# Patient Record
Sex: Female | Born: 1965 | Race: Black or African American | Smoking: Current every day smoker
Health system: Southern US, Community
[De-identification: ages and names within clinical notes are randomized; demographics above are authoritative.]

## PROBLEM LIST (undated history)

## (undated) DIAGNOSIS — I10 Essential (primary) hypertension: Secondary | ICD-10-CM

## (undated) DIAGNOSIS — E7800 Pure hypercholesterolemia, unspecified: Secondary | ICD-10-CM

## (undated) HISTORY — DX: Essential (primary) hypertension: I10

## (undated) HISTORY — PX: DILATION AND CURETTAGE OF UTERUS: SHX78

## (undated) HISTORY — DX: Pure hypercholesterolemia, unspecified: E78.00

## (undated) HISTORY — PX: TUBAL LIGATION: SHX77

## (undated) HISTORY — PX: KNEE SURGERY: SHX244

---

## 2019-09-29 ENCOUNTER — Ambulatory Visit: Payer: Self-pay | Attending: Internal Medicine

## 2019-09-29 DIAGNOSIS — Z23 Encounter for immunization: Secondary | ICD-10-CM

## 2019-09-29 NOTE — Progress Notes (Signed)
   Covid-19 Vaccination Clinic  Name:  Jeanette Tran    MRN: [id number] DOB: [birth year]  09/29/2019  [name] was observed post Covid-19 immunization for 15 minutes without incident. She was provided with Vaccine Information Sheet and instruction to access the V-Safe system.   [name] was instructed to call 911 with any severe reactions post vaccine: Marland Kitchen Difficulty breathing  . Swelling of face and throat  . A fast heartbeat  . A bad rash all over body  . Dizziness and weakness   Immunizations Administered    Name Date Dose VIS Date Route   Moderna COVID-19 Vaccine 09/29/2019 11:40 AM 0.5 mL 06/20/2019 Intramuscular   Manufacturer: Moderna   Lot: [id number]   NDC: 01751-025-85

## 2019-11-01 ENCOUNTER — Ambulatory Visit: Payer: Self-pay | Attending: Internal Medicine

## 2019-11-01 DIAGNOSIS — Z23 Encounter for immunization: Secondary | ICD-10-CM

## 2019-11-01 NOTE — Progress Notes (Signed)
   Covid-19 Vaccination Clinic  Name:  Citlaly Camplin    MRN: [id number] DOB: [birth year]  11/01/2019  Ms. Cadiente was observed post Covid-19 immunization for 15 minutes without incident. She was provided with Vaccine Information Sheet and instruction to access the V-Safe system.   Ms. Hilgeman was instructed to call 911 with any severe reactions post vaccine: Marland Kitchen Difficulty breathing  . Swelling of face and throat  . A fast heartbeat  . A bad rash all over body  . Dizziness and weakness   Immunizations Administered    Name Date Dose VIS Date Route   Moderna COVID-19 Vaccine 11/01/2019 11:30 AM 0.5 mL 06/20/2019 Intramuscular   Manufacturer: Moderna   Lot: [id number]   NDC: 96283-662-94

## 2021-04-21 ENCOUNTER — Encounter: Payer: Self-pay | Admitting: Obstetrics & Gynecology

## 2021-04-21 ENCOUNTER — Ambulatory Visit (INDEPENDENT_AMBULATORY_CARE_PROVIDER_SITE_OTHER): Payer: 59 | Admitting: Obstetrics & Gynecology

## 2021-04-21 ENCOUNTER — Other Ambulatory Visit: Payer: Self-pay

## 2021-04-21 ENCOUNTER — Other Ambulatory Visit (HOSPITAL_COMMUNITY)
Admission: RE | Admit: 2021-04-21 | Discharge: 2021-04-21 | Disposition: A | Discharge status: Home / Self Care | Payer: 59 | Source: Ambulatory Visit | Attending: Obstetrics & Gynecology | Admitting: Obstetrics & Gynecology

## 2021-04-21 VITALS — BP 131/84 | HR 81 | Ht 63.0 in | Wt 183.0 lb

## 2021-04-21 DIAGNOSIS — Z01419 Encounter for gynecological examination (general) (routine) without abnormal findings: Secondary | ICD-10-CM | POA: Diagnosis not present

## 2021-04-21 DIAGNOSIS — Z1231 Encounter for screening mammogram for malignant neoplasm of breast: Secondary | ICD-10-CM | POA: Diagnosis not present

## 2021-04-21 DIAGNOSIS — Z1212 Encounter for screening for malignant neoplasm of rectum: Secondary | ICD-10-CM | POA: Diagnosis not present

## 2021-04-21 DIAGNOSIS — Z124 Encounter for screening for malignant neoplasm of cervix: Secondary | ICD-10-CM

## 2021-04-21 DIAGNOSIS — Z1211 Encounter for screening for malignant neoplasm of colon: Secondary | ICD-10-CM

## 2021-04-21 LAB — HEMOCCULT GUIAC POC 1CARD (OFFICE): Fecal Occult Blood, POC: NEGATIVE

## 2021-04-21 NOTE — Progress Notes (Signed)
Chief Complaint  Patient presents with   pap only      55 y.o. [id number] No LMP recorded. Patient is postmenopausal. The current method of family planning is post menopausal status.  Outpatient Encounter Medications as of 04/21/2021  Medication Sig   diphenhydrAMINE (BENADRYL) 25 mg capsule Take by mouth.   EPINEPHrine 0.3 mg/0.3 mL IJ SOAJ injection Inject into the muscle.   loratadine (CLARITIN) 10 MG tablet Take by mouth.   Omega-3 Fatty Acids (OMEGA 3 PO) Take by mouth.   Potassium Chloride ER 20 MEQ TBCR Take 1 tablet by mouth 2 (two) times daily.   rosuvastatin (CRESTOR) 10 MG tablet Take 10 mg by mouth at bedtime.   triamterene-hydrochlorothiazide (MAXZIDE-25) 37.5-25 MG tablet Take 1 tablet by mouth daily.   VITAMIN D PO Take by mouth.   No facility-administered encounter medications on file as of 04/21/2021.    Subjective Pt is here for gyn exam only No complaints Normal paps in the past Past Medical History:  Diagnosis Date   High cholesterol    Hypertension     Past Surgical History:  Procedure Laterality Date   DILATION AND CURETTAGE OF UTERUS     x 2   KNEE SURGERY Left    TUBAL LIGATION      OB History     Gravida  6   Para  4   Term  4   Preterm      AB  2   Living  4      SAB  2   IAB      Ectopic      Multiple      Live Births  4           Allergies  Allergen Reactions   Bee Venom Swelling    Social History   Socioeconomic History   Marital status: Legally Separated    Spouse name: Not on file   Number of children: Not on file   Years of education: Not on file   Highest education level: Not on file  Occupational History   Not on file  Tobacco Use   Smoking status: Every Day    Packs/day: 1.00    Years: 20.00    Pack years: 20.00    Types: Cigarettes   Smokeless tobacco: Never  Vaping Use   Vaping Use: Never used  Substance and Sexual Activity   Alcohol use: Not Currently   Drug use: Never    Sexual activity: Yes    Birth control/protection: Surgical    Comment: tubal  Other Topics Concern   Not on file  Social History Narrative   Not on file   Social Determinants of Health   Financial Resource Strain: Low Risk    Difficulty of Paying Living Expenses: Not very hard  Food Insecurity: No Food Insecurity   Worried About Running Out of Food in the Last Year: Never true   Ran Out of Food in the Last Year: Never true  Transportation Needs: No Transportation Needs   Lack of Transportation (Medical): No   Lack of Transportation (Non-Medical): No  Physical Activity: Inactive   Days of Exercise per Week: 3 days   Minutes of Exercise per Session: 0 min  Stress: No Stress Concern Present   Feeling of Stress : Not at all  Social Connections: Unknown   Frequency of Communication with Friends and Family: More than three times a week   Frequency of Social Gatherings  with Friends and Family: Twice a week   Attends Religious Services: Patient refused   Active Member of Clubs or Organizations: No   Attends Engineer, structural: Never   Marital Status: Separated    Family History  Problem Relation Age of Onset   Heart attack Father    Cancer Mother    Hypertension Brother    Cancer Brother        prostate   High Cholesterol Brother    Seizures Brother    High Cholesterol Sister    Hypertension Sister    Other Sister        had a disease that attacked major organs; needed a lung transplant   Hypertension Daughter     Medications:       Current Outpatient Medications:    diphenhydrAMINE (BENADRYL) 25 mg capsule, Take by mouth., Disp: , Rfl:    EPINEPHrine 0.3 mg/0.3 mL IJ SOAJ injection, Inject into the muscle., Disp: , Rfl:    loratadine (CLARITIN) 10 MG tablet, Take by mouth., Disp: , Rfl:    Omega-3 Fatty Acids (OMEGA 3 PO), Take by mouth., Disp: , Rfl:    Potassium Chloride ER 20 MEQ TBCR, Take 1 tablet by mouth 2 (two) times daily., Disp: , Rfl:     rosuvastatin (CRESTOR) 10 MG tablet, Take 10 mg by mouth at bedtime., Disp: , Rfl:    triamterene-hydrochlorothiazide (MAXZIDE-25) 37.5-25 MG tablet, Take 1 tablet by mouth daily., Disp: , Rfl:    VITAMIN D PO, Take by mouth., Disp: , Rfl:   Objective Blood pressure 131/84, pulse 81, height 5\' 3"  (1.6 m), weight 183 lb (83 kg).  General WDWN female NAD Vulva:  normal appearing vulva with no masses, tenderness or lesions Vagina:  normal mucosa, no discharge Cervix:  Normal no lesions Uterus:  normal size, contour, position, consistency, mobility, non-tender Adnexa: ovaries:present,  normal adnexa in size, nontender and no masses Rectal norma tone no masses heme occult is negative  Pertinent ROS No burning with urination, frequency or urgency No nausea, vomiting or diarrhea Nor fever chills or other constitutional symptoms   Labs or studies     Impression Diagnoses this Encounter::   ICD-10-CM   1. Well woman exam with routine gynecological exam  Z01.419     2. Routine cervical smear  Z12.4 Cytology - PAP( [facility])    3. Screening for colorectal cancer  Z12.11 POCT occult blood stool   Z12.12     4. Breast cancer screening by mammogram  Z12.31 MM 3D SCREEN BREAST BILATERAL    MM 3D SCREEN BREAST BILATERAL      Established relevant diagnosis(es):   Plan/Recommendations: No orders of the defined types were placed in this encounter.   Labs or Scans Ordered: Orders Placed This Encounter  Procedures   MM 3D SCREEN BREAST BILATERAL   POCT occult blood stool    Management:: Normal gyn exam Mammogram ordered  Follow up Return in about 5 years (around 04/21/2026) for gyn exam.       All questions were answered.

## 2021-04-23 LAB — CYTOLOGY - PAP
Chlamydia: NEGATIVE
Comment: NEGATIVE
Comment: NEGATIVE
Comment: NORMAL
Diagnosis: NEGATIVE
High risk HPV: NEGATIVE
Neisseria Gonorrhea: NEGATIVE

## 2021-05-19 ENCOUNTER — Ambulatory Visit (HOSPITAL_COMMUNITY)
Admission: RE | Admit: 2021-05-19 | Discharge: 2021-05-19 | Disposition: A | Discharge status: Home / Self Care | Payer: 59 | Source: Ambulatory Visit | Attending: Obstetrics & Gynecology | Admitting: Obstetrics & Gynecology

## 2021-05-19 ENCOUNTER — Other Ambulatory Visit: Payer: Self-pay

## 2021-05-19 DIAGNOSIS — Z1231 Encounter for screening mammogram for malignant neoplasm of breast: Secondary | ICD-10-CM | POA: Diagnosis not present

## 2021-05-20 ENCOUNTER — Other Ambulatory Visit (HOSPITAL_COMMUNITY): Payer: Self-pay | Admitting: Obstetrics & Gynecology

## 2021-05-20 DIAGNOSIS — R928 Other abnormal and inconclusive findings on diagnostic imaging of breast: Secondary | ICD-10-CM

## 2021-06-05 ENCOUNTER — Ambulatory Visit (HOSPITAL_COMMUNITY)
Admission: RE | Admit: 2021-06-05 | Discharge: 2021-06-05 | Disposition: A | Discharge status: Home / Self Care | Payer: 59 | Source: Ambulatory Visit | Attending: Obstetrics & Gynecology | Admitting: Obstetrics & Gynecology

## 2021-06-05 ENCOUNTER — Other Ambulatory Visit: Payer: Self-pay

## 2021-06-05 ENCOUNTER — Other Ambulatory Visit (HOSPITAL_COMMUNITY): Payer: Self-pay | Admitting: Obstetrics & Gynecology

## 2021-06-05 DIAGNOSIS — R928 Other abnormal and inconclusive findings on diagnostic imaging of breast: Secondary | ICD-10-CM | POA: Diagnosis present

## 2021-07-01 ENCOUNTER — Encounter (HOSPITAL_COMMUNITY): Payer: 59

## 2021-07-01 ENCOUNTER — Other Ambulatory Visit (HOSPITAL_COMMUNITY): Payer: 59

## 2021-11-05 ENCOUNTER — Other Ambulatory Visit (HOSPITAL_COMMUNITY): Payer: Self-pay | Admitting: Obstetrics & Gynecology

## 2021-11-05 DIAGNOSIS — N6310 Unspecified lump in the right breast, unspecified quadrant: Secondary | ICD-10-CM

## 2021-12-09 ENCOUNTER — Ambulatory Visit (HOSPITAL_COMMUNITY)
Admission: RE | Admit: 2021-12-09 | Discharge: 2021-12-09 | Disposition: A | Discharge status: Home / Self Care | Payer: 59 | Source: Ambulatory Visit | Attending: Obstetrics & Gynecology | Admitting: Obstetrics & Gynecology

## 2021-12-09 DIAGNOSIS — N6310 Unspecified lump in the right breast, unspecified quadrant: Secondary | ICD-10-CM

## 2021-12-09 DIAGNOSIS — N6320 Unspecified lump in the left breast, unspecified quadrant: Secondary | ICD-10-CM | POA: Diagnosis present

## 2022-04-02 DIAGNOSIS — Z6835 Body mass index (BMI) 35.0-35.9, adult: Secondary | ICD-10-CM | POA: Diagnosis not present

## 2022-04-02 DIAGNOSIS — Z0000 Encounter for general adult medical examination without abnormal findings: Secondary | ICD-10-CM | POA: Diagnosis not present

## 2022-07-02 DIAGNOSIS — E785 Hyperlipidemia, unspecified: Secondary | ICD-10-CM | POA: Diagnosis not present

## 2022-07-02 DIAGNOSIS — J301 Allergic rhinitis due to pollen: Secondary | ICD-10-CM | POA: Diagnosis not present

## 2022-07-02 DIAGNOSIS — I10 Essential (primary) hypertension: Secondary | ICD-10-CM | POA: Diagnosis not present

## 2022-07-02 DIAGNOSIS — Z6835 Body mass index (BMI) 35.0-35.9, adult: Secondary | ICD-10-CM | POA: Diagnosis not present

## 2022-10-06 DIAGNOSIS — I10 Essential (primary) hypertension: Secondary | ICD-10-CM | POA: Diagnosis not present

## 2022-10-06 DIAGNOSIS — J301 Allergic rhinitis due to pollen: Secondary | ICD-10-CM | POA: Diagnosis not present

## 2022-10-06 DIAGNOSIS — Z6836 Body mass index (BMI) 36.0-36.9, adult: Secondary | ICD-10-CM | POA: Diagnosis not present

## 2022-10-06 DIAGNOSIS — E785 Hyperlipidemia, unspecified: Secondary | ICD-10-CM | POA: Diagnosis not present

## 2022-10-30 DIAGNOSIS — J301 Allergic rhinitis due to pollen: Secondary | ICD-10-CM | POA: Diagnosis not present

## 2022-10-30 DIAGNOSIS — I10 Essential (primary) hypertension: Secondary | ICD-10-CM | POA: Diagnosis not present

## 2022-10-30 DIAGNOSIS — E785 Hyperlipidemia, unspecified: Secondary | ICD-10-CM | POA: Diagnosis not present

## 2022-12-22 DIAGNOSIS — Z130 Encounter for screening for diseases of the blood and blood-forming organs and certain disorders involving the immune mechanism: Secondary | ICD-10-CM | POA: Diagnosis not present

## 2022-12-22 DIAGNOSIS — Z1329 Encounter for screening for other suspected endocrine disorder: Secondary | ICD-10-CM | POA: Diagnosis not present

## 2022-12-22 DIAGNOSIS — Z13228 Encounter for screening for other metabolic disorders: Secondary | ICD-10-CM | POA: Diagnosis not present

## 2023-01-07 DIAGNOSIS — Z6836 Body mass index (BMI) 36.0-36.9, adult: Secondary | ICD-10-CM | POA: Diagnosis not present

## 2023-01-07 DIAGNOSIS — E785 Hyperlipidemia, unspecified: Secondary | ICD-10-CM | POA: Diagnosis not present

## 2023-01-07 DIAGNOSIS — J301 Allergic rhinitis due to pollen: Secondary | ICD-10-CM | POA: Diagnosis not present

## 2023-01-07 DIAGNOSIS — I10 Essential (primary) hypertension: Secondary | ICD-10-CM | POA: Diagnosis not present

## 2023-01-12 DIAGNOSIS — Z6835 Body mass index (BMI) 35.0-35.9, adult: Secondary | ICD-10-CM | POA: Diagnosis not present

## 2023-01-12 DIAGNOSIS — E785 Hyperlipidemia, unspecified: Secondary | ICD-10-CM | POA: Diagnosis not present

## 2023-01-12 DIAGNOSIS — J301 Allergic rhinitis due to pollen: Secondary | ICD-10-CM | POA: Diagnosis not present

## 2023-01-12 DIAGNOSIS — I10 Essential (primary) hypertension: Secondary | ICD-10-CM | POA: Diagnosis not present

## 2023-04-07 DIAGNOSIS — Z6836 Body mass index (BMI) 36.0-36.9, adult: Secondary | ICD-10-CM | POA: Diagnosis not present

## 2023-04-07 DIAGNOSIS — J301 Allergic rhinitis due to pollen: Secondary | ICD-10-CM | POA: Diagnosis not present

## 2023-04-07 DIAGNOSIS — I10 Essential (primary) hypertension: Secondary | ICD-10-CM | POA: Diagnosis not present

## 2023-04-07 DIAGNOSIS — E785 Hyperlipidemia, unspecified: Secondary | ICD-10-CM | POA: Diagnosis not present

## 2023-05-21 IMAGING — US US BREAST*L* LIMITED INC AXILLA
1 series · 13 of 17 positions shown · non-contrast
Comparison: Previous exams.

CLINICAL DATA: Screening recall for possible right breast mass and
left breast asymmetry.



[Series 1: us breast*left* limited inc axilla · 0.06mm/px · 13 of 17 slices shown]
[im 1/17]
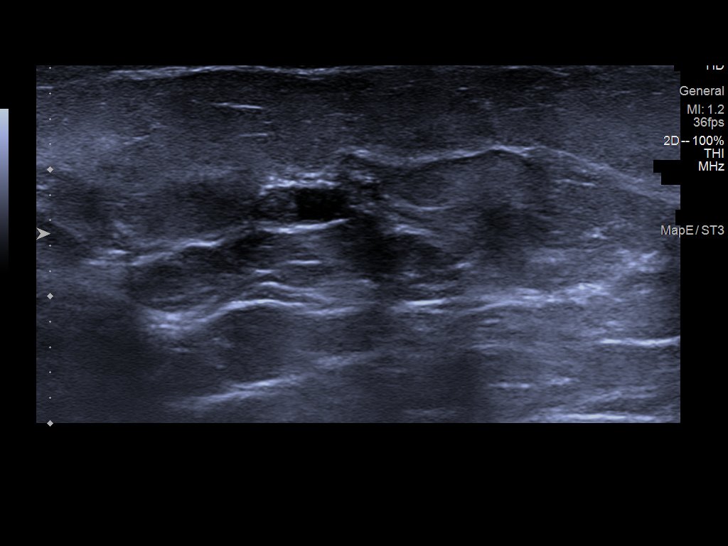
[im 2/17]
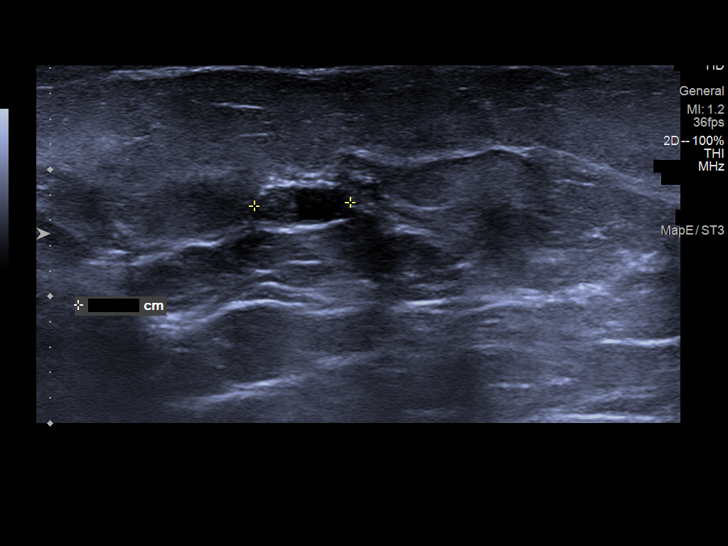
[im 4/17]
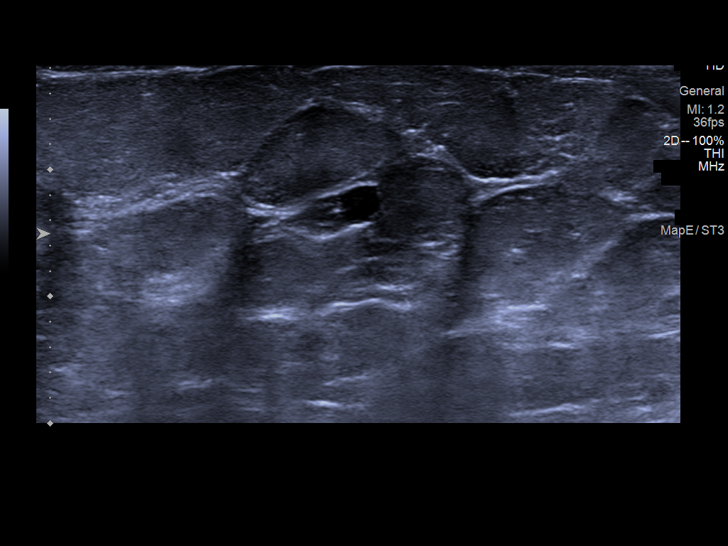
[im 5/17]
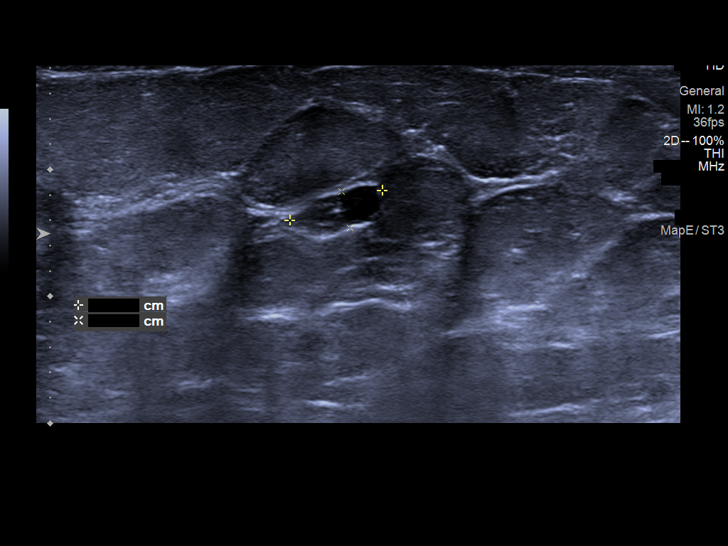
[im 6/17]
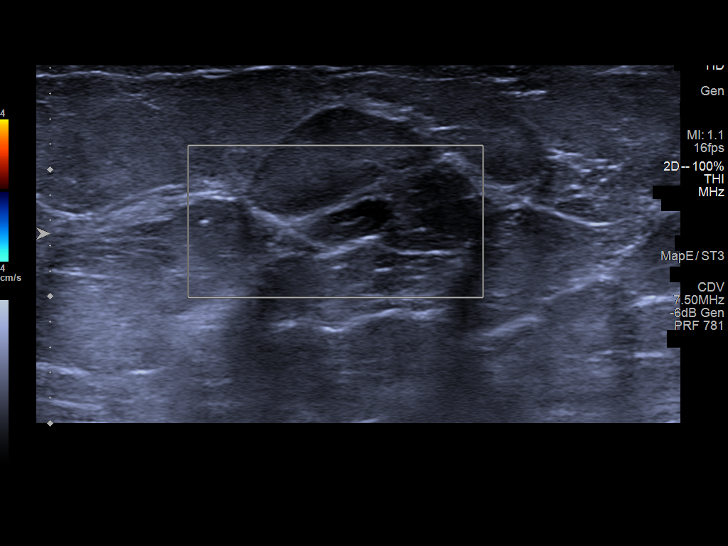
[im 8/17]
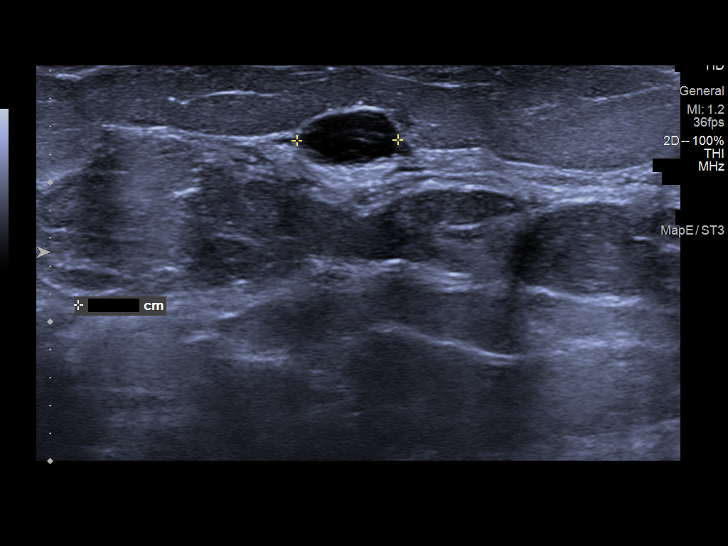
[im 9/17]
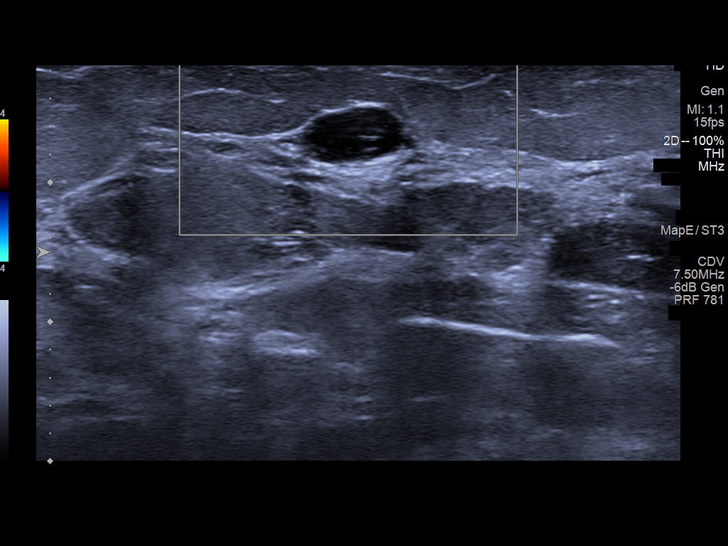
[im 10/17]
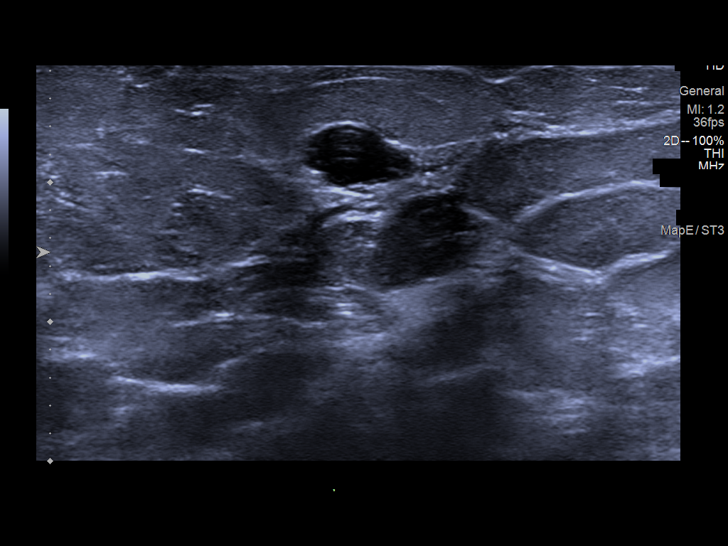
[im 12/17]
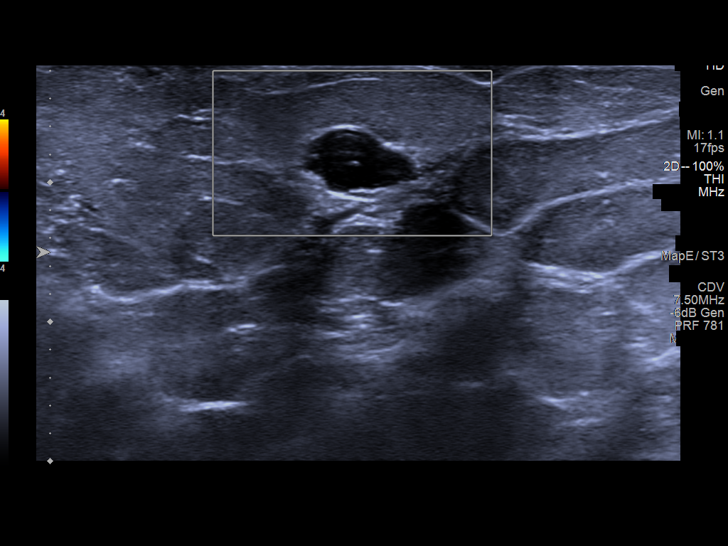
[im 13/17]
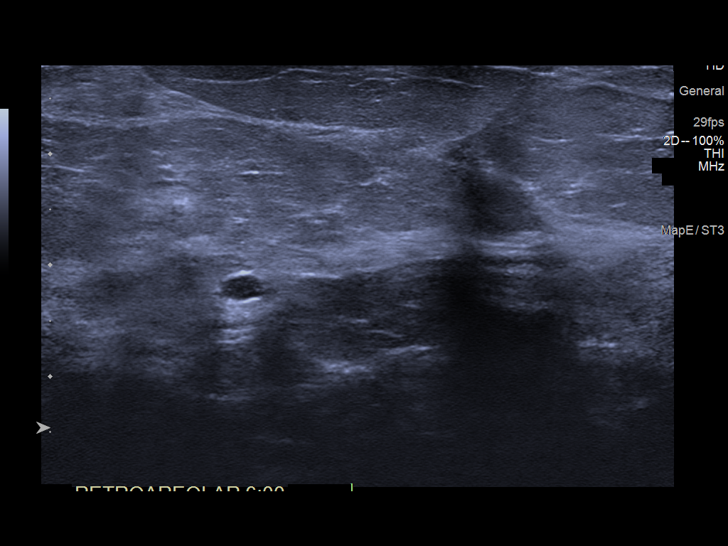
[im 14/17]
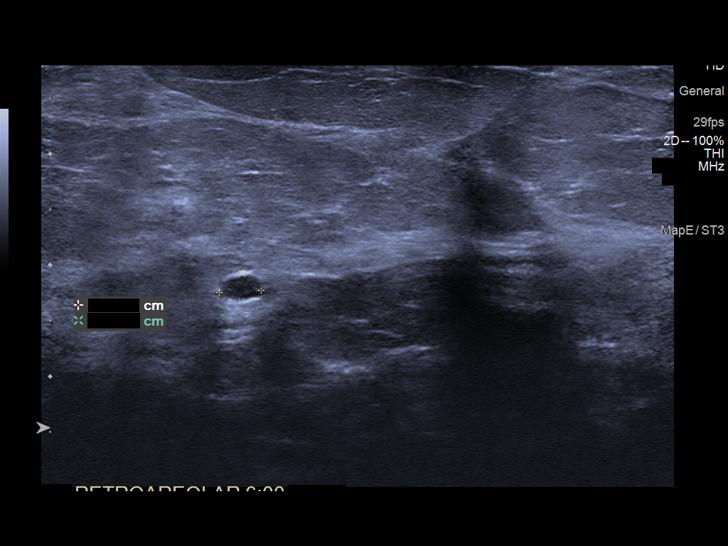
[im 16/17]
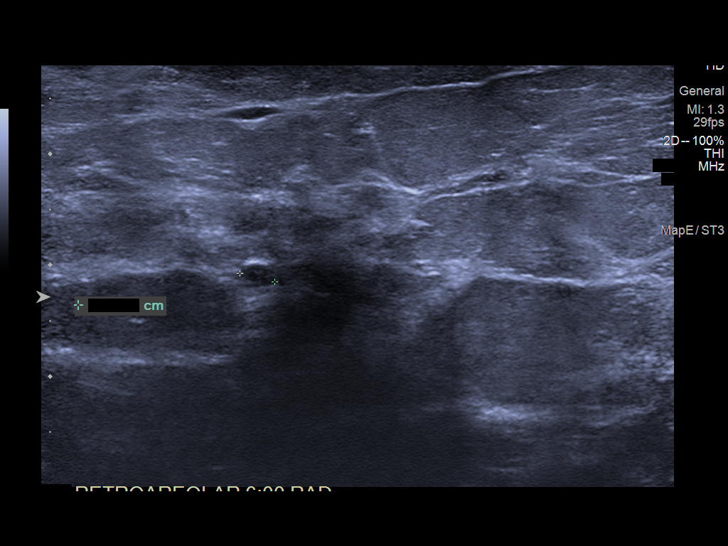
[im 17/17]
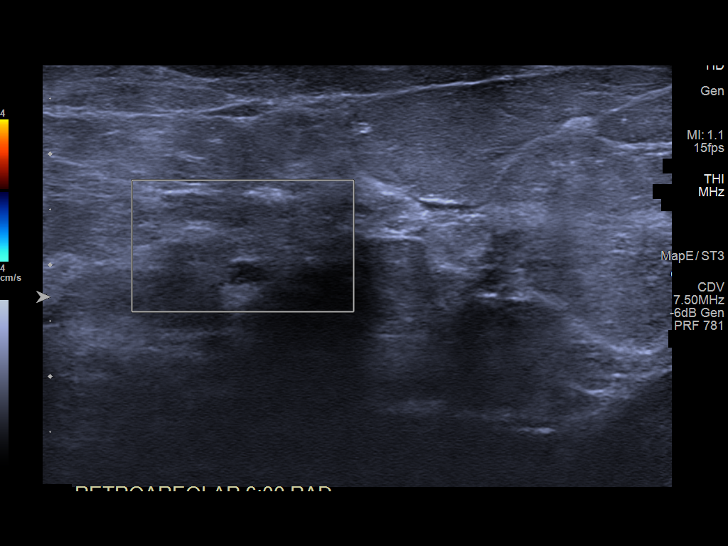

[13 of 17 positions shown; findings below may reference images not displayed]

ACR Breast Density Category c: The breast tissue is heterogeneously
dense, which may obscure small masses.
FINDINGS: Additional tomograms were performed of the bilateral breast. There
is an oval low density in the lower inner posterior right breast
measuring 0.7 cm. There is an oval lobulated mass in the lower
central left breast measuring 0.7 cm.

Targeted ultrasound of the lower inner right breast was performed
demonstrating multiple scattered cysts, with a
complicated/debris-filled cyst at 4 o'clock 7 cm from nipple
measuring 0.8 x 0.3 x 0.8 cm. There may correspond with the mass
seen in the lower inner right breast at mammography.

Targeted ultrasound the lower/central left breast was performed
demonstrating numerous cysts. A cyst at 6 o'clock 7 cm from nipple
measures 0.7 x 0.5 x 0.9 cm. This may but not with the mass seen in
the left breast at mammography. An additional cyst at 6 o'clock
retroareolar mid to posterior depth measures 0.4 x 0.3 x 0.3 cm. No
suspicious masses or abnormality seen in the central or lower left
breast.
IMPRESSION: Probably benign bilateral breast masses.

RECOMMENDATION:
Recommend bilateral diagnostic mammography with possible ultrasound
in 6 months to demonstrate stability of the probably benign
bilateral breast masses.

I have discussed the findings and recommendations with the patient.
If applicable, a reminder letter will be sent to the patient
regarding the next appointment.

BI-RADS CATEGORY  3: Probably benign.

## 2023-05-21 IMAGING — MG DIGITAL DIAGNOSTIC BILAT W/ TOMO W/ CAD
8 of 15 series · 8 of 40 positions shown · non-contrast
Comparison: Previous exams.

CLINICAL DATA: Screening recall for possible right breast mass and
left breast asymmetry.



[R CC synth-2D (1 of 3)]
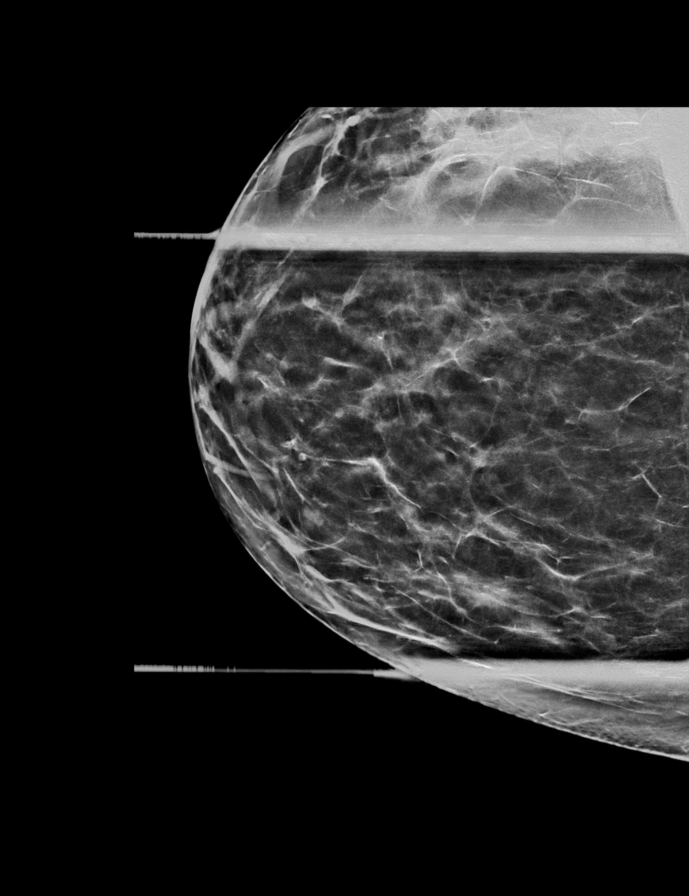

[R MLO synth-2D]
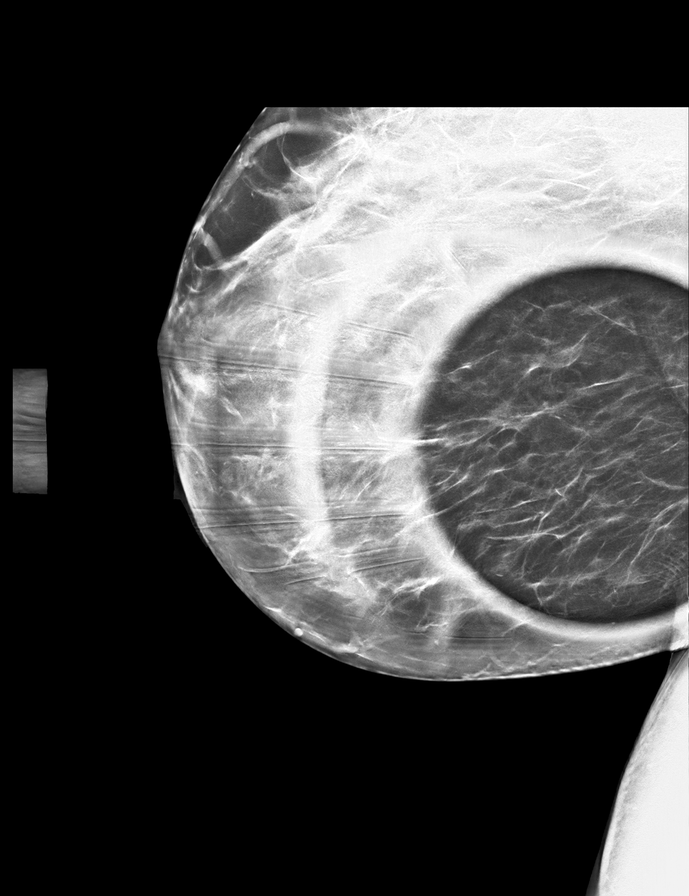

[R CC synth-2D (2 of 3)]
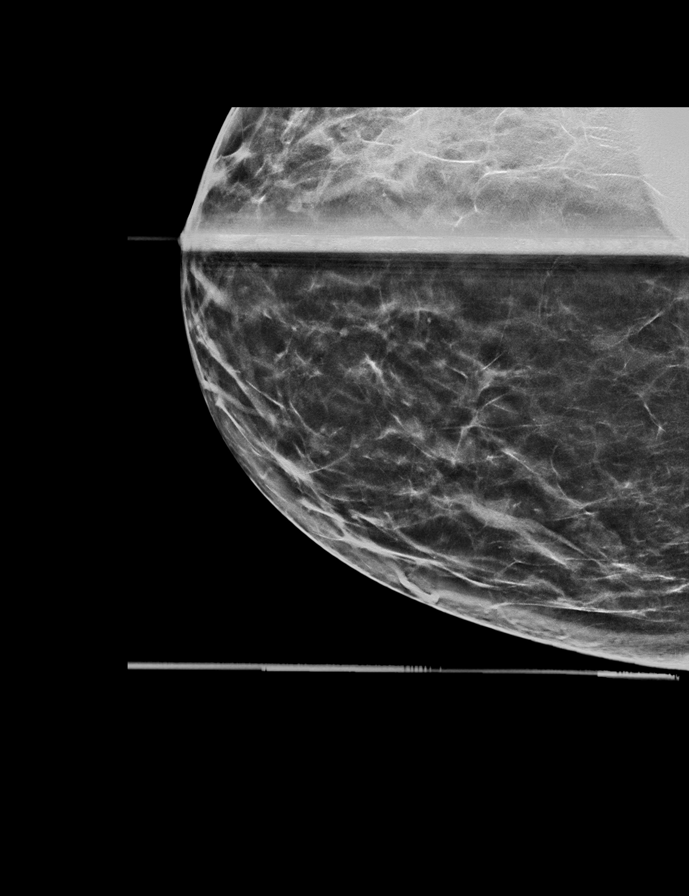

[R CC synth-2D (3 of 3)]
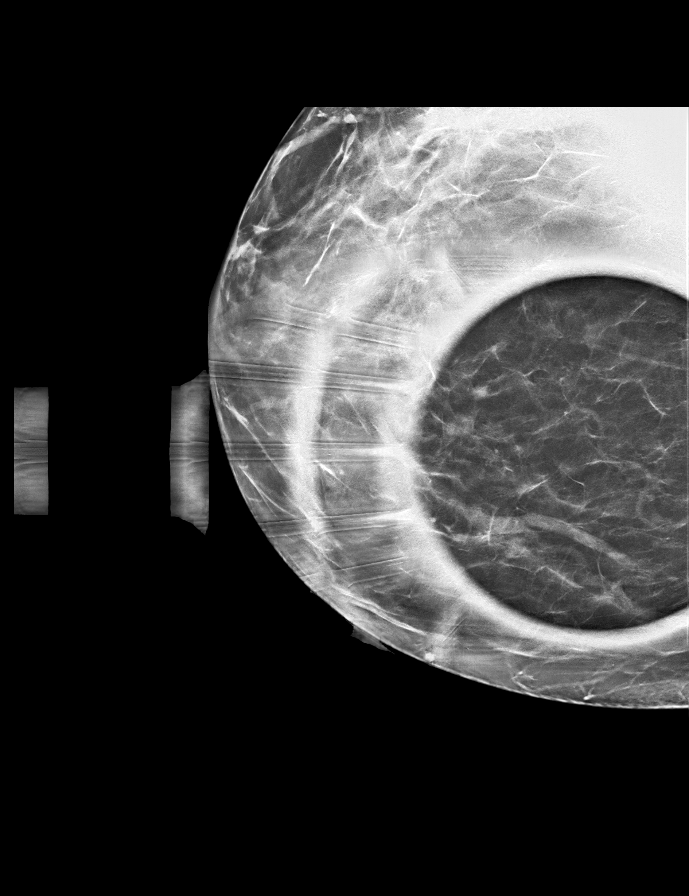

[L CC synth-2D]
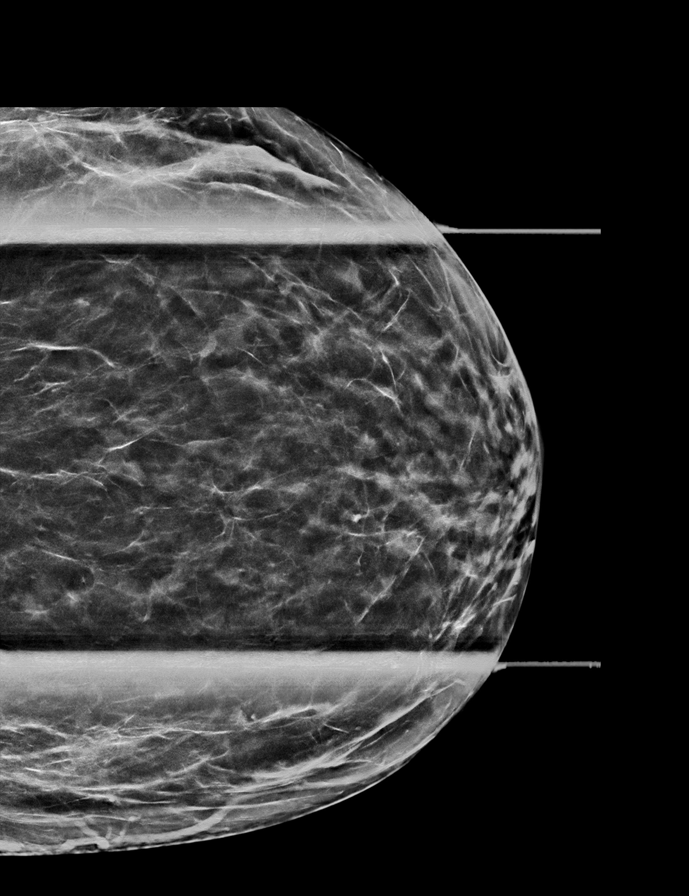

[L MLO synth-2D (1 of 2)]
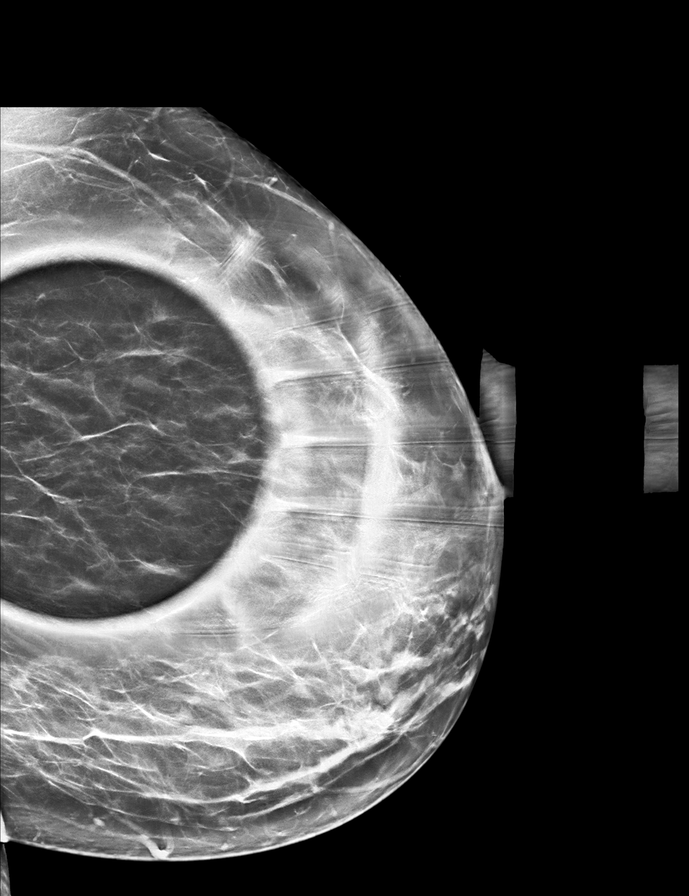

[L MLO synth-2D (2 of 2)]
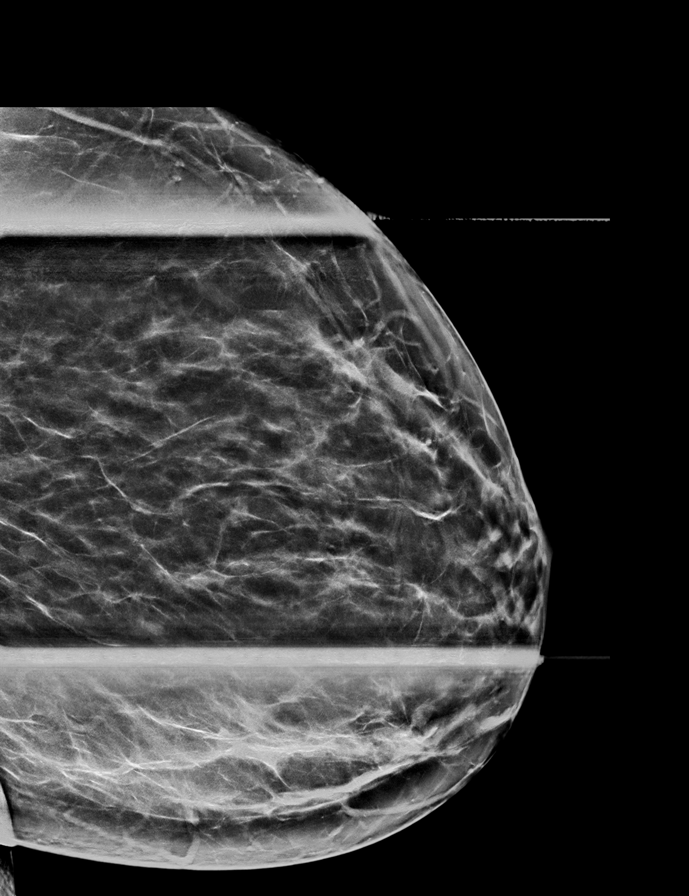

[R CC tomo · tomo slice 45/65.0]
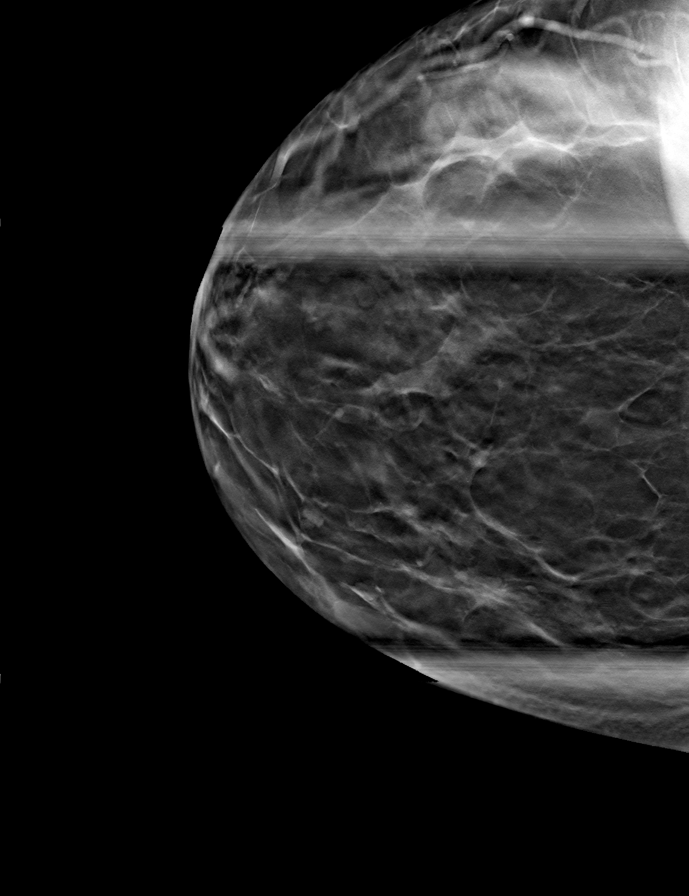

[8 of 40 positions shown; findings below may reference images not displayed]

ACR Breast Density Category c: The breast tissue is heterogeneously
dense, which may obscure small masses.
FINDINGS: Additional tomograms were performed of the bilateral breast. There
is an oval low density in the lower inner posterior right breast
measuring 0.7 cm. There is an oval lobulated mass in the lower
central left breast measuring 0.7 cm.

Targeted ultrasound of the lower inner right breast was performed
demonstrating multiple scattered cysts, with a
complicated/debris-filled cyst at 4 o'clock 7 cm from nipple
measuring 0.8 x 0.3 x 0.8 cm. There may correspond with the mass
seen in the lower inner right breast at mammography.

Targeted ultrasound the lower/central left breast was performed
demonstrating numerous cysts. A cyst at 6 o'clock 7 cm from nipple
measures 0.7 x 0.5 x 0.9 cm. This may but not with the mass seen in
the left breast at mammography. An additional cyst at 6 o'clock
retroareolar mid to posterior depth measures 0.4 x 0.3 x 0.3 cm. No
suspicious masses or abnormality seen in the central or lower left
breast.
IMPRESSION: Probably benign bilateral breast masses.

RECOMMENDATION:
Recommend bilateral diagnostic mammography with possible ultrasound
in 6 months to demonstrate stability of the probably benign
bilateral breast masses.

I have discussed the findings and recommendations with the patient.
If applicable, a reminder letter will be sent to the patient
regarding the next appointment.

BI-RADS CATEGORY  3: Probably benign.

## 2023-07-08 DIAGNOSIS — J301 Allergic rhinitis due to pollen: Secondary | ICD-10-CM | POA: Diagnosis not present

## 2023-07-08 DIAGNOSIS — I10 Essential (primary) hypertension: Secondary | ICD-10-CM | POA: Diagnosis not present

## 2023-07-08 DIAGNOSIS — E785 Hyperlipidemia, unspecified: Secondary | ICD-10-CM | POA: Diagnosis not present

## 2023-07-08 DIAGNOSIS — Z6829 Body mass index (BMI) 29.0-29.9, adult: Secondary | ICD-10-CM | POA: Diagnosis not present

## 2023-10-07 DIAGNOSIS — Z6835 Body mass index (BMI) 35.0-35.9, adult: Secondary | ICD-10-CM | POA: Diagnosis not present

## 2023-10-07 DIAGNOSIS — Z0000 Encounter for general adult medical examination without abnormal findings: Secondary | ICD-10-CM | POA: Diagnosis not present

## 2024-01-13 DIAGNOSIS — I10 Essential (primary) hypertension: Secondary | ICD-10-CM | POA: Diagnosis not present

## 2024-01-13 DIAGNOSIS — E785 Hyperlipidemia, unspecified: Secondary | ICD-10-CM | POA: Diagnosis not present

## 2024-01-13 DIAGNOSIS — F17210 Nicotine dependence, cigarettes, uncomplicated: Secondary | ICD-10-CM | POA: Diagnosis not present

## 2024-01-13 DIAGNOSIS — Z6835 Body mass index (BMI) 35.0-35.9, adult: Secondary | ICD-10-CM | POA: Diagnosis not present

## 2024-01-13 DIAGNOSIS — J301 Allergic rhinitis due to pollen: Secondary | ICD-10-CM | POA: Diagnosis not present

## 2024-01-14 DIAGNOSIS — F17210 Nicotine dependence, cigarettes, uncomplicated: Secondary | ICD-10-CM | POA: Diagnosis not present

## 2024-01-14 DIAGNOSIS — I10 Essential (primary) hypertension: Secondary | ICD-10-CM | POA: Diagnosis not present

## 2024-01-14 DIAGNOSIS — E785 Hyperlipidemia, unspecified: Secondary | ICD-10-CM | POA: Diagnosis not present

## 2024-01-14 DIAGNOSIS — J301 Allergic rhinitis due to pollen: Secondary | ICD-10-CM | POA: Diagnosis not present

## 2024-04-17 DIAGNOSIS — I10 Essential (primary) hypertension: Secondary | ICD-10-CM | POA: Diagnosis not present

## 2024-04-17 DIAGNOSIS — N182 Chronic kidney disease, stage 2 (mild): Secondary | ICD-10-CM | POA: Diagnosis not present

## 2024-04-17 DIAGNOSIS — F17210 Nicotine dependence, cigarettes, uncomplicated: Secondary | ICD-10-CM | POA: Diagnosis not present

## 2024-04-17 DIAGNOSIS — Z6834 Body mass index (BMI) 34.0-34.9, adult: Secondary | ICD-10-CM | POA: Diagnosis not present

## 2024-04-17 DIAGNOSIS — E785 Hyperlipidemia, unspecified: Secondary | ICD-10-CM | POA: Diagnosis not present

## 2024-04-17 DIAGNOSIS — J301 Allergic rhinitis due to pollen: Secondary | ICD-10-CM | POA: Diagnosis not present
